# Patient Record
Sex: Male | Born: 1955 | Hispanic: No | Marital: Married | Smoking: Never smoker
Health system: Southern US, Community
[De-identification: ages and names within clinical notes are randomized; demographics above are authoritative.]

## PROBLEM LIST (undated history)

## (undated) DIAGNOSIS — E119 Type 2 diabetes mellitus without complications: Secondary | ICD-10-CM

---

## 2019-07-10 ENCOUNTER — Other Ambulatory Visit: Payer: Self-pay

## 2019-07-10 ENCOUNTER — Ambulatory Visit (INDEPENDENT_AMBULATORY_CARE_PROVIDER_SITE_OTHER): Payer: BLUE CROSS/BLUE SHIELD | Admitting: Podiatry

## 2019-07-10 VITALS — Temp 98.5°F

## 2019-07-10 DIAGNOSIS — L603 Nail dystrophy: Secondary | ICD-10-CM

## 2019-07-14 NOTE — Progress Notes (Signed)
   HPI: 63 y.o. male presenting today as a new patient with a chief complaint of an injury to the left great toenail that occurred two days ago. He states he hit the nail on a table and started experiencing pain immediately. He states the pain has subsided now mostly except for when he walks. He has not done anything for treatment. Patient is here for further evaluation and treatment.   No past medical history on file.   Physical Exam: General: The patient is alert and oriented x3 in no acute distress.  Dermatology: Partially detached left great toenail. Skin is warm, dry and supple bilateral lower extremities. Negative for open lesions or macerations.  Vascular: Palpable pedal pulses bilaterally. No edema or erythema noted. Capillary refill within normal limits.  Neurological: Epicritic and protective threshold grossly intact bilaterally.   Musculoskeletal Exam: Range of motion within normal limits to all pedal and ankle joints bilateral. Muscle strength 5/5 in all groups bilateral.   Assessment: 1. Partially detached left great toenail secondary to stabbing injury    Plan of Care:  1. Patient evaluated.  2. Patient declined avulsion procedure.  3. Silicone toe cap dispensed.  4. Recommended good shoe gear. 5. Return to clinic as needed.       Edrick Kins, DPM [facility]  Dr. Edrick Kins, DPM    [address], [place] [address]                Office [phone]  Fax [phone]

## 2019-07-17 ENCOUNTER — Ambulatory Visit: Payer: BLUE CROSS/BLUE SHIELD | Admitting: Podiatry

## 2019-08-19 ENCOUNTER — Other Ambulatory Visit: Payer: Self-pay

## 2019-08-19 ENCOUNTER — Encounter: Payer: Self-pay | Admitting: Podiatry

## 2019-08-19 ENCOUNTER — Ambulatory Visit (INDEPENDENT_AMBULATORY_CARE_PROVIDER_SITE_OTHER): Payer: BLUE CROSS/BLUE SHIELD | Admitting: Podiatry

## 2019-08-19 DIAGNOSIS — S90212A Contusion of left great toe with damage to nail, initial encounter: Secondary | ICD-10-CM | POA: Diagnosis not present

## 2019-08-19 DIAGNOSIS — M79675 Pain in left toe(s): Secondary | ICD-10-CM | POA: Diagnosis not present

## 2019-08-19 DIAGNOSIS — L603 Nail dystrophy: Secondary | ICD-10-CM

## 2019-08-19 NOTE — Patient Instructions (Signed)

## 2019-08-19 NOTE — Progress Notes (Signed)
  Subjective:  Patient ID: Dennis Lawson, male    DOB: 05/13/1956,  MRN: [id number]  Chief Complaint  Patient presents with  . Nail Problem    follow up left hallux great toenail.  "its doing better, but I want it removed today"    63 y.o. male presents with the above complaint.  He states that he was seeing Dr. Amalia Hailey for this.  He had a contusion of the left great toe a month ago.  Dr. Amalia Hailey I recommended removing the toenails to allow it to drain properly.  However given the amount of pain patient declined to remove the toenail at that time.  He presents today with decreasing pain and would like to have the toenail removed.  He states that the toenail was pretty loose and is getting caught up on things.  He states that it is still aggravating he has not done anything to alleviate the pain.  He denies any other nausea fever chills vomiting shortness of breath.   Review of Systems: Negative except as noted in the HPI. Denies N/V/F/Ch.  No past medical history on file.  Current Outpatient Medications:  .  metFORMIN (GLUMETZA) 1000 MG (MOD) 24 hr tablet, Take 1,000 mg by mouth daily with breakfast., Disp: , Rfl:   Social History   Tobacco Use  Smoking Status Never Smoker  Smokeless Tobacco Never Used    No Known Allergies Objective:  There were no vitals filed for this visit. There is no height or weight on file to calculate BMI. Constitutional Well developed. Well nourished.  Vascular Dorsalis pedis pulses palpable bilaterally. Posterior tibial pulses palpable bilaterally. Capillary refill normal to all digits.  No cyanosis or clubbing noted. Pedal hair growth normal.  Neurologic Normal speech. Oriented to person, place, and time. Epicritic sensation to light touch grossly present bilaterally.  Dermatologic Painful contusion nail at Total nail borders of the hallux nail left. No other open wounds. No skin lesions.  Orthopedic: Normal joint ROM without pain or crepitus  bilaterally. No visible deformities. No bony tenderness.   Radiographs: None Assessment:  No diagnosis found. Plan:  Patient was evaluated and treated and all questions answered.  Total nail avulsion secondary to nail contusion, left -Patient elects to proceed with minor surgery to remove total toenail removal today. Consent reviewed and signed by patient. -Ingrown nail excised. See procedure note. -Educated on post-procedure care including soaking. Written instructions provided and reviewed. -Patient to follow up in 2 weeks for nail check.  Procedure: Excision of Ingrown Toenail Location: Left 1st toe Total nail borders. Anesthesia: Lidocaine 1% plain; 1.5 mL and Marcaine 0.5% plain; 1.5 mL, digital block. Skin Prep: Betadine. Dressing: Silvadene; telfa; dry, sterile, compression dressing. Technique: Following skin prep, the toe was exsanguinated and a tourniquet was secured at the base of the toe. The affected nail border was freed, split with a nail splitter, and excised. The tourniquet was then removed and sterile dressing applied. Disposition: Patient tolerated procedure well. Patient to return in 2 weeks for follow-up.   Return in about 2 weeks (around 09/02/2019).

## 2019-09-02 ENCOUNTER — Ambulatory Visit (INDEPENDENT_AMBULATORY_CARE_PROVIDER_SITE_OTHER): Payer: Self-pay | Admitting: Podiatry

## 2019-09-02 ENCOUNTER — Encounter: Payer: Self-pay | Admitting: Podiatry

## 2019-09-02 ENCOUNTER — Other Ambulatory Visit: Payer: Self-pay

## 2019-09-02 DIAGNOSIS — L603 Nail dystrophy: Secondary | ICD-10-CM

## 2019-09-02 DIAGNOSIS — S90212A Contusion of left great toe with damage to nail, initial encounter: Secondary | ICD-10-CM

## 2019-09-02 DIAGNOSIS — M79675 Pain in left toe(s): Secondary | ICD-10-CM

## 2019-09-02 NOTE — Progress Notes (Signed)
Subjective: Dennis Lawson is a 63 y.o.  male returns to office today for follow up evaluation after having left Hallux  total nail avulsion performed. Patient has been soaking using epsom salt and applying topical antibiotic covered with bandaid daily. Patient denies fevers, chills, nausea, vomiting. Denies any calf pain, chest pain, SOB.  left Objective:  Vitals: Reviewed  General: Well developed, nourished, in no acute distress, alert and oriented x3   Dermatology: Skin is warm, dry and supple bilateral. total hallux nail border appears to be clean, dry, with mild granular tissue and surrounding scab. There is no surrounding erythema, edema, drainage/purulence. The remaining nails appear unremarkable at this time. There are no other lesions or other signs of infection present.  Neurovascular status: Intact. No lower extremity swelling; No pain with calf compression bilateral.  Musculoskeletal: Decreased tenderness to palpation of the total hallux nail bed. Muscular strength within normal limits bilateral.   Assesement and Plan: S/p total nail avulsion, doing well.   -Continue soaking in epsom salts twice a day followed by antibiotic ointment and a band-aid. Can leave uncovered at night. Continue this until completely healed.  -If the area has not healed in 2 weeks, call the office for follow-up appointment, or sooner if any problems arise.  -Monitor for any signs/symptoms of infection. Call the office immediately if any occur or go directly to the emergency room. Call with any questions/concerns.  Boneta Lucks, DPM

## 2019-12-15 ENCOUNTER — Emergency Department: Payer: 59

## 2019-12-15 ENCOUNTER — Other Ambulatory Visit: Payer: Self-pay

## 2019-12-15 ENCOUNTER — Inpatient Hospital Stay
Admission: EM | Admit: 2019-12-15 | Discharge: 2019-12-18 | DRG: 177 | Disposition: A | Discharge status: Home / Self Care | Payer: 59 | Attending: Hospitalist | Admitting: Hospitalist

## 2019-12-15 DIAGNOSIS — Z681 Body mass index (BMI) 19 or less, adult: Secondary | ICD-10-CM | POA: Diagnosis not present

## 2019-12-15 DIAGNOSIS — J1282 Pneumonia due to coronavirus disease 2019: Secondary | ICD-10-CM | POA: Diagnosis present

## 2019-12-15 DIAGNOSIS — E119 Type 2 diabetes mellitus without complications: Secondary | ICD-10-CM

## 2019-12-15 DIAGNOSIS — R636 Underweight: Secondary | ICD-10-CM | POA: Diagnosis present

## 2019-12-15 DIAGNOSIS — J069 Acute upper respiratory infection, unspecified: Secondary | ICD-10-CM | POA: Diagnosis present

## 2019-12-15 DIAGNOSIS — R197 Diarrhea, unspecified: Secondary | ICD-10-CM | POA: Diagnosis present

## 2019-12-15 DIAGNOSIS — T380X5A Adverse effect of glucocorticoids and synthetic analogues, initial encounter: Secondary | ICD-10-CM | POA: Diagnosis present

## 2019-12-15 DIAGNOSIS — J9601 Acute respiratory failure with hypoxia: Secondary | ICD-10-CM | POA: Diagnosis present

## 2019-12-15 DIAGNOSIS — U071 COVID-19: Secondary | ICD-10-CM | POA: Diagnosis present

## 2019-12-15 DIAGNOSIS — A0839 Other viral enteritis: Secondary | ICD-10-CM | POA: Diagnosis present

## 2019-12-15 DIAGNOSIS — R0902 Hypoxemia: Secondary | ICD-10-CM

## 2019-12-15 DIAGNOSIS — Z833 Family history of diabetes mellitus: Secondary | ICD-10-CM

## 2019-12-15 DIAGNOSIS — Z7984 Long term (current) use of oral hypoglycemic drugs: Secondary | ICD-10-CM | POA: Diagnosis not present

## 2019-12-15 DIAGNOSIS — Y92239 Unspecified place in hospital as the place of occurrence of the external cause: Secondary | ICD-10-CM | POA: Diagnosis present

## 2019-12-15 DIAGNOSIS — E1165 Type 2 diabetes mellitus with hyperglycemia: Secondary | ICD-10-CM | POA: Diagnosis present

## 2019-12-15 HISTORY — DX: Type 2 diabetes mellitus without complications: E11.9

## 2019-12-15 LAB — COMPREHENSIVE METABOLIC PANEL
ALT: 56 U/L — ABNORMAL HIGH (ref 0–44)
AST: 38 U/L (ref 15–41)
Albumin: 3.2 g/dL — ABNORMAL LOW (ref 3.5–5.0)
Alkaline Phosphatase: 55 U/L (ref 38–126)
Anion gap: 16 — ABNORMAL HIGH (ref 5–15)
BUN: 10 mg/dL (ref 8–23)
CO2: 23 mmol/L (ref 22–32)
Calcium: 8.8 mg/dL — ABNORMAL LOW (ref 8.9–10.3)
Chloride: 93 mmol/L — ABNORMAL LOW (ref 98–111)
Creatinine, Ser: 0.72 mg/dL (ref 0.61–1.24)
GFR calc Af Amer: >60 mL/min (ref >60)
GFR calc non Af Amer: >60 mL/min (ref >60)
Glucose, Bld: 260 mg/dL — ABNORMAL HIGH (ref 70–99)
Potassium: 3.8 mmol/L (ref 3.5–5.1)
Sodium: 132 mmol/L — ABNORMAL LOW (ref 135–145)
Total Bilirubin: 1.1 mg/dL (ref 0.3–1.2)
Total Protein: 8 g/dL (ref 6.5–8.1)

## 2019-12-15 LAB — TRIGLYCERIDES: Triglycerides: 137 mg/dL (ref <150)

## 2019-12-15 LAB — CBC WITH DIFFERENTIAL/PLATELET
Abs Immature Granulocytes: 0.03 10*3/uL (ref 0.00–0.07)
Basophils Absolute: 0 10*3/uL (ref 0.0–0.1)
Basophils Relative: 0 %
Eosinophils Absolute: 0 10*3/uL (ref 0.0–0.5)
Eosinophils Relative: 1 %
HCT: 43 % (ref 39.0–52.0)
Hemoglobin: 15.3 g/dL (ref 13.0–17.0)
Immature Granulocytes: 1 %
Lymphocytes Relative: 22 %
Lymphs Abs: 1 10*3/uL (ref 0.7–4.0)
MCH: 29 pg (ref 26.0–34.0)
MCHC: 35.6 g/dL (ref 30.0–36.0)
MCV: 81.6 fL (ref 80.0–100.0)
Monocytes Absolute: 0.7 10*3/uL (ref 0.1–1.0)
Monocytes Relative: 15 %
Neutro Abs: 2.9 10*3/uL (ref 1.7–7.7)
Neutrophils Relative %: 61 %
Platelets: 278 10*3/uL (ref 150–400)
RBC: 5.27 MIL/uL (ref 4.22–5.81)
RDW: 11.9 % (ref 11.5–15.5)
WBC: 4.7 10*3/uL (ref 4.0–10.5)
nRBC: 0 % (ref 0.0–0.2)

## 2019-12-15 LAB — HIV ANTIBODY (ROUTINE TESTING W REFLEX): HIV Screen 4th Generation wRfx: NONREACTIVE

## 2019-12-15 LAB — FIBRIN DERIVATIVES D-DIMER (ARMC ONLY): Fibrin derivatives D-dimer (ARMC): 567.2 ng/mL (FEU) — ABNORMAL HIGH (ref 0.00–499.00)

## 2019-12-15 LAB — LACTIC ACID, PLASMA
Lactic Acid, Venous: 1.7 mmol/L (ref 0.5–1.9)
Lactic Acid, Venous: 1.1 mmol/L (ref 0.5–1.9)

## 2019-12-15 LAB — GLUCOSE, CAPILLARY
Glucose-Capillary: 349 mg/dL — ABNORMAL HIGH (ref 70–99)
Glucose-Capillary: 231 mg/dL — ABNORMAL HIGH (ref 70–99)

## 2019-12-15 LAB — FERRITIN
Ferritin: 172 ng/mL (ref 24–336)
Ferritin: 132 ng/mL (ref 24–336)

## 2019-12-15 LAB — HEPATITIS B SURFACE ANTIGEN: Hepatitis B Surface Ag: NONREACTIVE

## 2019-12-15 LAB — C-REACTIVE PROTEIN: CRP: 5 mg/dL — ABNORMAL HIGH (ref <1.0)

## 2019-12-15 LAB — LACTATE DEHYDROGENASE: LDH: 202 U/L — ABNORMAL HIGH (ref 98–192)

## 2019-12-15 LAB — BRAIN NATRIURETIC PEPTIDE: B Natriuretic Peptide: 44 pg/mL (ref 0.0–100.0)

## 2019-12-15 LAB — FIBRINOGEN: Fibrinogen: 614 mg/dL — ABNORMAL HIGH (ref 210–475)

## 2019-12-15 LAB — PROCALCITONIN: Procalcitonin: <0.1 ng/mL

## 2019-12-15 LAB — TROPONIN I (HIGH SENSITIVITY)
Troponin I (High Sensitivity): 4 ng/L (ref <18)
Troponin I (High Sensitivity): 3 ng/L (ref <18)

## 2019-12-15 MED ORDER — ENOXAPARIN SODIUM 40 MG/0.4ML ~~LOC~~ SOLN
40.0000 mg | SUBCUTANEOUS | Status: DC
  Administered 2019-12-15 – 2019-12-17 (×3): 40 mg via SUBCUTANEOUS
  Filled 2019-12-15 (×3): qty 0.4

## 2019-12-15 MED ORDER — ONDANSETRON HCL 4 MG/2ML IJ SOLN: 4.0000 mg | Freq: Three times a day (TID) | INTRAMUSCULAR | Status: DC | PRN

## 2019-12-15 MED ORDER — SODIUM CHLORIDE 0.9 % IV SOLN
100.0000 mg | Freq: Every day | INTRAVENOUS | Status: DC
  Administered 2019-12-16 – 2019-12-18 (×3): 100 mg via INTRAVENOUS
  Filled 2019-12-15 (×3): qty 20

## 2019-12-15 MED ORDER — SODIUM CHLORIDE 0.9 % IV SOLN
200.0000 mg | Freq: Once | INTRAVENOUS | Status: AC
  Administered 2019-12-15: 11:00:00 200 mg via INTRAVENOUS
  Filled 2019-12-15: qty 200

## 2019-12-15 MED ORDER — ACETAMINOPHEN 325 MG PO TABS: 650.0000 mg | ORAL_TABLET | Freq: Four times a day (QID) | ORAL | Status: DC | PRN

## 2019-12-15 MED ORDER — DM-GUAIFENESIN ER 30-600 MG PO TB12
1.0000 | ORAL_TABLET | Freq: Two times a day (BID) | ORAL | Status: DC
  Administered 2019-12-15 – 2019-12-18 (×7): 1 via ORAL
  Filled 2019-12-15 (×7): qty 1

## 2019-12-15 MED ORDER — ALBUTEROL SULFATE HFA 108 (90 BASE) MCG/ACT IN AERS
2.0000 | INHALATION_SPRAY | RESPIRATORY_TRACT | Status: DC | PRN
  Filled 2019-12-15: qty 6.7

## 2019-12-15 MED ORDER — INSULIN ASPART 100 UNIT/ML ~~LOC~~ SOLN
0.0000 [IU] | Freq: Every day | SUBCUTANEOUS | Status: DC
  Administered 2019-12-15: 2 [IU] via SUBCUTANEOUS
  Filled 2019-12-15: qty 1

## 2019-12-15 MED ORDER — ZINC SULFATE 220 (50 ZN) MG PO CAPS
220.0000 mg | ORAL_CAPSULE | Freq: Every day | ORAL | Status: DC
  Administered 2019-12-15 – 2019-12-18 (×4): 220 mg via ORAL
  Filled 2019-12-15 (×4): qty 1

## 2019-12-15 MED ORDER — VITAMIN B-12 1000 MCG PO TABS
1000.0000 ug | ORAL_TABLET | Freq: Every day | ORAL | Status: DC
  Administered 2019-12-16 – 2019-12-18 (×3): 1000 ug via ORAL
  Filled 2019-12-15 (×3): qty 1

## 2019-12-15 MED ORDER — PNEUMOCOCCAL VAC POLYVALENT 25 MCG/0.5ML IJ INJ: 0.5000 mL | INJECTION | INTRAMUSCULAR | Status: DC

## 2019-12-15 MED ORDER — ASCORBIC ACID 500 MG PO TABS
500.0000 mg | ORAL_TABLET | Freq: Every day | ORAL | Status: DC
  Administered 2019-12-15 – 2019-12-18 (×4): 500 mg via ORAL
  Filled 2019-12-15 (×4): qty 1

## 2019-12-15 MED ORDER — IPRATROPIUM BROMIDE HFA 17 MCG/ACT IN AERS
2.0000 | INHALATION_SPRAY | RESPIRATORY_TRACT | Status: DC
  Administered 2019-12-15 – 2019-12-18 (×17): 2 via RESPIRATORY_TRACT
  Filled 2019-12-15: qty 12.9

## 2019-12-15 MED ORDER — METHYLPREDNISOLONE SODIUM SUCC 40 MG IJ SOLR
40.0000 mg | Freq: Two times a day (BID) | INTRAMUSCULAR | Status: DC
  Administered 2019-12-15 – 2019-12-17 (×4): 40 mg via INTRAVENOUS
  Filled 2019-12-15 (×4): qty 1

## 2019-12-15 MED ORDER — DEXAMETHASONE SODIUM PHOSPHATE 10 MG/ML IJ SOLN
10.0000 mg | Freq: Once | INTRAMUSCULAR | Status: AC
  Administered 2019-12-15: 10 mg via INTRAVENOUS
  Filled 2019-12-15: qty 1

## 2019-12-15 MED ORDER — INSULIN ASPART 100 UNIT/ML ~~LOC~~ SOLN
0.0000 [IU] | Freq: Three times a day (TID) | SUBCUTANEOUS | Status: DC
  Administered 2019-12-15 – 2019-12-16 (×2): 7 [IU] via SUBCUTANEOUS
  Filled 2019-12-15 (×2): qty 1

## 2019-12-15 NOTE — ED Notes (Signed)
Attempted to call receiving floor. Gave name and number. Awaiting return call.

## 2019-12-15 NOTE — ED Notes (Signed)
Decreased [place] from 4 L to 2 L. Will continue to monitor. Pt resting at this time.

## 2019-12-15 NOTE — H&P (Signed)
History and Physical    Dennis Lawson VQQ:[id number] DOB: [birth year] DOA: 12/15/2019  Referring MD/NP/PA:   PCP: Barbette Reichmann, MD   Patient coming from:  The patient is coming from home.  At baseline, pt is independent for most of ADL.        Chief Complaint: Shortness of breath and cough  HPI: Dennis Lawson is a 64 y.o. male with medical history significant of diabetes mellitus, who presents with a short of breath and a cough.  Patient was diagnosed with COVID-19 infection 10 days ago, he received monoclonal antibody treatment at the clinic on 2/25.  Patient continues to have shortness of breath and productive cough.  Patient does not have chest pain.  Patient has some chest congestion.  He had fever of 101 on Monday and chills.  Patient has some diarrhea, no vomiting, abdominal pain.  No symptoms of UTI or unilateral weakness. Pt had oxygen desaturation to 81% on room air.  ED Course: pt was found to have WBC 4.3, troponin IV, lactic acid 1.7, electrolytes renal function okay, liver function (ALP 55, AST 38, ALT 56, total bilirubin 1.1), temperature normal, blood pressure 13/83, heart rate 67, tachypnea, oxygen saturation 95%-99% on 4 L nasal cannula oxygen.  Chest x-ray showed bilateral patchy infiltration.  Patient is admitted to MedSurg bed as inpatient.  Review of Systems:   General: has fevers, chills, no body weight gain, has fatigue HEENT: no blurry vision, hearing changes or sore throat Respiratory: has dyspnea, coughing, no wheezing CV: no chest pain, no palpitations GI: no nausea, vomiting, abdominal pain, diarrhea, constipation GU: no dysuria, burning on urination, increased urinary frequency, hematuria  Ext: no leg edema Neuro: no unilateral weakness, numbness, or tingling, no vision change or hearing loss Skin: no rash, no skin tear. MSK: No muscle spasm, no deformity, no limitation of range of movement in spin Heme: No easy bruising.  Travel history: No recent long  distant travel.  Allergy: No Known Allergies  Past Medical History:  Diagnosis Date  . Diabetes mellitus without complication (HCC)     History reviewed. No pertinent surgical history.  Social History:  reports that he has never smoked. He has never used smokeless tobacco. He reports previous alcohol use. He reports that he does not use drugs.  Family History:  Family History  Problem Relation Age of Onset  . Diabetes Mellitus II Mother   . Dementia Father      Prior to Admission medications   Medication Sig Start Date End Date Taking? Authorizing Provider  metFORMIN (GLUMETZA) 1000 MG (MOD) 24 hr tablet Take 1,000 mg by mouth daily with breakfast.    [provider]    Physical Exam: Vitals:   12/15/19 1115 12/15/19 1130 12/15/19 1200 12/15/19 1201  BP:  133/76 (!) 145/83   Pulse: 76 (!) 53 73 72  Resp: (!) 22 (!) 35 (!) 38 19  Temp:      SpO2: 97% 100% 97% 97%  Weight:      Height:       General: Not in acute distress HEENT:       Eyes: PERRL, EOMI, no scleral icterus.       ENT: No discharge from the ears and nose, no pharynx injection, no tonsillar enlargement.        Neck: No JVD, no bruit, no mass felt. Heme: No neck lymph node enlargement. Cardiac: S1/S2, RRR, No murmurs, No gallops or rubs. Respiratory:  No rales, wheezing, rhonchi or rubs. GI:  Soft, nondistended, nontender, no rebound pain, no organomegaly, BS present. GU: No hematuria Ext: No pitting leg edema bilaterally. 2+DP/PT pulse bilaterally. Musculoskeletal: No joint deformities, No joint redness or warmth, no limitation of ROM in spin. Skin: No rashes.  Neuro: Alert, oriented X3, cranial nerves II-XII grossly intact, moves all extremities normally.  Psych: Patient is not psychotic, no suicidal or hemocidal ideation.  Labs on Admission: I have personally reviewed following labs and imaging studies  CBC: Recent Labs  Lab 12/15/19 0825  WBC 4.7  NEUTROABS 2.9  HGB 15.3  HCT 43.0    MCV 81.6  PLT 161   Basic Metabolic Panel: Recent Labs  Lab 12/15/19 0825  NA 132*  K 3.8  CL 93*  CO2 23  GLUCOSE 260*  BUN 10  CREATININE 0.72  CALCIUM 8.8*   GFR: Estimated Creatinine Clearance: 80.2 mL/min (by C-G formula based on SCr of 0.72 mg/dL). Liver Function Tests: Recent Labs  Lab 12/15/19 0825  AST 38  ALT 56*  ALKPHOS 55  BILITOT 1.1  PROT 8.0  ALBUMIN 3.2*   No results for input(s): LIPASE, AMYLASE in the last 168 hours. No results for input(s): AMMONIA in the last 168 hours. Coagulation Profile: No results for input(s): INR, PROTIME in the last 168 hours. Cardiac Enzymes: No results for input(s): CKTOTAL, CKMB, CKMBINDEX, TROPONINI in the last 168 hours. BNP (last 3 results) No results for input(s): PROBNP in the last 8760 hours. HbA1C: No results for input(s): HGBA1C in the last 72 hours. CBG: No results for input(s): GLUCAP in the last 168 hours. Lipid Profile: Recent Labs    12/15/19 0825  TRIG 137   Thyroid Function Tests: No results for input(s): TSH, T4TOTAL, FREET4, T3FREE, THYROIDAB in the last 72 hours. Anemia Panel: Recent Labs    12/15/19 0825  FERRITIN 172   Urine analysis: No results found for: COLORURINE, APPEARANCEUR, LABSPEC, PHURINE, GLUCOSEU, HGBUR, BILIRUBINUR, KETONESUR, PROTEINUR, UROBILINOGEN, NITRITE, LEUKOCYTESUR Sepsis Labs: @LABRCNTIP (procalcitonin:4,lacticidven:4) )No results found for this or any previous visit (from the past 240 hour(s)).   Radiological Exams on Admission: DG Chest Port 1 View  Result Date: 12/15/2019 CLINICAL DATA:  COVID positive. Short notice of breath. EXAM: PORTABLE CHEST 1 VIEW COMPARISON:  None. FINDINGS: Patchy airspace disease noted bilaterally with a basilar predominance, left greater than right. No pleural effusion. The cardiopericardial silhouette is within normal limits for size. The visualized bony structures of the thorax are intact. Telemetry leads overlie the chest.  IMPRESSION: Patchy bibasilar airspace disease, left greater than right. Electronically Signed   By: Misty Stanley M.D.   On: 12/15/2019 09:00     EKG: Independently reviewed.  Sinus rhythm, QTC 433, anteroseptal infarction pattern  Assessment/Plan Principal Problem:   Acute respiratory disease due to COVID-19 virus Active Problems:   Diabetes mellitus without complication (HCC)   Acute respiratory failure with hypoxia (HCC)   Diarrhea   Acute respiratory failure with hypoxia due to acute respiratory disease due to COVID-19 virus: Patient had oxygen desaturated to 81% on room air, initially needed 4 L nasal cannula oxygen, currently on 2 L nasal cannula oxygen with oxygen saturation 97%.  Chest x-ray showed bilateral patchy infiltration.  -will admit to med-surg bed as inpt -Remdesivir per pharm -Solumedrol 40 mg bid -vitamin C, zinc.  -Bronchodilators -PRN Mucinex for cough -f/u Blood culture -D-dimer, BNP,Trop, LFT, CRP, LDH, Procalcitonin, Ferritin, fibinogen, TG, Hep B SAg, HIV ab -Daily CRP, Ferritin, D-dimer, -Will ask the patient to maintain an awake prone  position for 16+ hours a day, if possible, with a minimum of 2-3 hours at a time -Will attempt to maintain euvolemia to a net negative fluid status -IF patient deteriorates, will consult PCCM and ID  Diarrhea: Most likely due to Covid infection. -Observe closely.  If patient has persistent worsening diarrhea, may need to check C. difficile.  Diabetes mellitus without complication (HCC):  Most recent A1c 11.0, poorly controled. Patient is taking Metformin and Amaryl at home -SSI   Inpatient status:  # Patient requires inpatient status due to high intensity of service, high risk for further deterioration and high frequency of surveillance required.  I certify that at the point of admission it is my clinical judgment that the patient will require inpatient hospital care spanning beyond 2 midnights from the point of  admission.  . This patient has hx of DM . Now patient has presenting with Acute respiratory disease due to COVID-19 virus and acute respiratory failure with hypoxia . The initial radiographic and laboratory data are worrisome because of bilateral patchy infiltrates on chest x-ray . Current medical needs: please see my assessment and plan . Predictability of an adverse outcome (risk): Patient has multiple comorbidities as listed above. Now presents with acute respiratory disease due to COVID-19 virus and acute respiratory failure with hypoxia. Patient's presentation is highly complicated.  Patient is at high risk of deteriorating.  Will need to be treated in hospital for at least 2 days.      DVT ppx:  SQ Lovenox Code Status: Full code Family Communication:   Yes, patient's  Daughter by phone   Disposition Plan:  Anticipate discharge back to previous home environment Consults called:  none Admission status: Med-surg as inpt       Date of Service 12/15/2019    Lorretta Harp [facility]   If 7PM-7AM, please contact night-coverage [email or web address] 12/15/2019, 12:54 PM

## 2019-12-15 NOTE — Consult Note (Addendum)
Remdesivir - Pharmacy Brief Note   A/P:  Remdesivir 200 mg IVPB once followed by 100 mg IVPB daily x 4 days.   No apparent history of remdesivir treatment  Tested positive at an outpatient clinic on 12/08/19.  Albina Billet, PharmD, BCPS Clinical Pharmacist 12/15/2019 10:20 AM

## 2019-12-15 NOTE — ED Triage Notes (Signed)
Pt arrives via [facility] from home for COVID + 10 days ago. C/o of SOB, diarrhea, cough. States had IV antibotics and steroids. Denies fever. 81% RA, placed on 4 L [place] and came up to 95%. Coughing upon arrival. Able to talk in complete sentences. A&O.

## 2019-12-15 NOTE — ED Provider Notes (Signed)
[facility] Emergency Department Provider Note  Time seen: 8:17 AM  I have reviewed the triage vital signs and the nursing notes.   HISTORY  Chief Complaint Shortness of Breath and COVID   HPI Dennis Lawson is a 64 y.o. male with a past medical history of diabetes presents to the emergency department for shortness of breath.  According to the patient he was diagnosed with Covid approximately 10 days ago.   Patient symptoms started 12/06/2019.  Patient states in record review shows patient received monoclonal antibody therapy 12/12/2019.  Patient states mild chest discomfort when coughing only.  Patient states shortness of breath currently 81% room air saturation upon arrival to the emergency department with no home O2 requirement.  No abdominal pain no vomiting, but occasional diarrhea.  Currently satting 95% on 4 L of oxygen.  Past Medical History:  Diagnosis Date  . Diabetes mellitus without complication (HCC)     There are no problems to display for this patient.   History reviewed. No pertinent surgical history.  Prior to Admission medications   Medication Sig Start Date End Date Taking? Authorizing Provider  metFORMIN (GLUMETZA) 1000 MG (MOD) 24 hr tablet Take 1,000 mg by mouth daily with breakfast.    [provider]    No Known Allergies  History reviewed. No pertinent family history.  Social History Social History   Tobacco Use  . Smoking status: Never Smoker  . Smokeless tobacco: Never Used  Substance Use Topics  . Alcohol use: Not Currently  . Drug use: Not on file    Review of Systems Constitutional: Positive for intermittent fever Cardiovascular: Mild chest pain worse with cough Respiratory: Positive for shortness of breath.  Positive for cough. Gastrointestinal: Negative for abdominal pain, vomiting.  Occasional diarrhea. Musculoskeletal: Negative for musculoskeletal complaints Neurological: Negative for headache All other  ROS negative  ____________________________________________   PHYSICAL EXAM:  VITAL SIGNS: ED Triage Vitals  Enc Vitals Group     BP --      Pulse --      Resp --      Temp --      Temp src --      SpO2 --      Weight 12/15/19 0815 134 lb (60.8 kg)     Height 12/15/19 0815 5\' 9"  (1.753 m)     Head Circumference --      Peak Flow --      Pain Score 12/15/19 0814 9     Pain Loc --      Pain Edu? --      Excl. in GC? --    Constitutional: Alert and oriented.  Patient is tachypneic but no significant respiratory distress. Eyes: Normal exam ENT      Head: Normocephalic and atraumatic.      Mouth/Throat: Mucous membranes are moist. Cardiovascular: Normal rate, regular rhythm.  Respiratory: Mild tachypnea with frequent coughing.  Overall clear lung sounds by auscultation. Gastrointestinal: Soft and nontender. No distention. Musculoskeletal: Nontender with normal range of motion in all extremities Neurologic:  Normal speech and language. No gross focal neurologic deficits  Skin:  Skin is warm, dry and intact.  Psychiatric: Mood and affect are normal.  ____________________________________________    EKG  EKG viewed and interpreted by myself shows a normal sinus rhythm at 59 bpm with a narrow QRS, normal axis, normal intervals, nonspecific ST changes.  ____________________________________________    RADIOLOGY  Chest x-ray shows patchy bibasilar airspace opacities.  ____________________________________________  INITIAL IMPRESSION / ASSESSMENT AND PLAN / ED COURSE  Pertinent labs & imaging results that were available during my care of the patient were reviewed by me and considered in my medical decision making (see chart for details).   Patient presents to the emergency department, Covid positive for worsening shortness of breath no hypoxia.  Symptoms started approximately 10 days ago at this time.  Patient received monoclonal antibody therapy 12/12/2019.  Patient states  continues to have intermittent fever continues to have intermittent diarrhea feels the shortness of breath has worsened over the past few days along with worsening cough.  We will check labs, chest x-ray.  We will dose IV Decadron and continue to closely monitor while awaiting results.  Patient agreeable to plan of care.  Given the patient's new O2 requirement anticipate likely admission to the hospital once his emergency department work-up is been completed.  X-ray shows bilateral opacities consistent with COVID-19.  Patient's inflammatory markers remain elevated.  Given the patient's hypoxia with oxygen requirement we will admit to the hospitalist service.  Dennis Lawson was evaluated in Emergency Department on 12/15/2019 for the symptoms described in the history of present illness. He was evaluated in the context of the global COVID-19 pandemic, which necessitated consideration that the patient might be at risk for infection with the SARS-CoV-2 virus that causes COVID-19. Institutional protocols and algorithms that pertain to the evaluation of patients at risk for COVID-19 are in a state of rapid change based on information released by regulatory bodies including the CDC and federal and state organizations. These policies and algorithms were followed during the patient's care in the ED.  ____________________________________________   FINAL CLINICAL IMPRESSION(S) / ED DIAGNOSES  COVID-19 Hypoxia Dyspnea   Harvest Dark, MD 12/15/19 1028

## 2019-12-16 DIAGNOSIS — R197 Diarrhea, unspecified: Secondary | ICD-10-CM

## 2019-12-16 LAB — COMPREHENSIVE METABOLIC PANEL
ALT: 53 U/L — ABNORMAL HIGH (ref 0–44)
AST: 32 U/L (ref 15–41)
Albumin: 2.9 g/dL — ABNORMAL LOW (ref 3.5–5.0)
Alkaline Phosphatase: 55 U/L (ref 38–126)
Anion gap: 11 (ref 5–15)
BUN: 21 mg/dL (ref 8–23)
CO2: 23 mmol/L (ref 22–32)
Calcium: 8.7 mg/dL — ABNORMAL LOW (ref 8.9–10.3)
Chloride: 100 mmol/L (ref 98–111)
Creatinine, Ser: 0.62 mg/dL (ref 0.61–1.24)
GFR calc Af Amer: >60 mL/min (ref >60)
GFR calc non Af Amer: >60 mL/min (ref >60)
Glucose, Bld: 267 mg/dL — ABNORMAL HIGH (ref 70–99)
Potassium: 4.4 mmol/L (ref 3.5–5.1)
Sodium: 134 mmol/L — ABNORMAL LOW (ref 135–145)
Total Bilirubin: 0.8 mg/dL (ref 0.3–1.2)
Total Protein: 7.1 g/dL (ref 6.5–8.1)

## 2019-12-16 LAB — CBC WITH DIFFERENTIAL/PLATELET
Abs Immature Granulocytes: 0.02 10*3/uL (ref 0.00–0.07)
Basophils Absolute: 0 10*3/uL (ref 0.0–0.1)
Basophils Relative: 0 %
Eosinophils Absolute: 0 10*3/uL (ref 0.0–0.5)
Eosinophils Relative: 0 %
HCT: 39.6 % (ref 39.0–52.0)
Hemoglobin: 14 g/dL (ref 13.0–17.0)
Immature Granulocytes: 1 %
Lymphocytes Relative: 15 %
Lymphs Abs: 0.6 10*3/uL — ABNORMAL LOW (ref 0.7–4.0)
MCH: 29.2 pg (ref 26.0–34.0)
MCHC: 35.4 g/dL (ref 30.0–36.0)
MCV: 82.5 fL (ref 80.0–100.0)
Monocytes Absolute: 0.3 10*3/uL (ref 0.1–1.0)
Monocytes Relative: 9 %
Neutro Abs: 2.9 10*3/uL (ref 1.7–7.7)
Neutrophils Relative %: 75 %
Platelets: 337 10*3/uL (ref 150–400)
RBC: 4.8 MIL/uL (ref 4.22–5.81)
RDW: 11.9 % (ref 11.5–15.5)
WBC: 3.9 10*3/uL — ABNORMAL LOW (ref 4.0–10.5)
nRBC: 0 % (ref 0.0–0.2)

## 2019-12-16 LAB — GLUCOSE, CAPILLARY
Glucose-Capillary: 333 mg/dL — ABNORMAL HIGH (ref 70–99)
Glucose-Capillary: 291 mg/dL — ABNORMAL HIGH (ref 70–99)
Glucose-Capillary: 259 mg/dL — ABNORMAL HIGH (ref 70–99)
Glucose-Capillary: 231 mg/dL — ABNORMAL HIGH (ref 70–99)

## 2019-12-16 LAB — C-REACTIVE PROTEIN: CRP: 2.7 mg/dL — ABNORMAL HIGH (ref <1.0)

## 2019-12-16 LAB — MAGNESIUM: Magnesium: 2.2 mg/dL (ref 1.7–2.4)

## 2019-12-16 LAB — FIBRIN DERIVATIVES D-DIMER (ARMC ONLY): Fibrin derivatives D-dimer (ARMC): 415.36 ng/mL (FEU) (ref 0.00–499.00)

## 2019-12-16 LAB — FERRITIN: Ferritin: 142 ng/mL (ref 24–336)

## 2019-12-16 MED ORDER — INSULIN DETEMIR 100 UNIT/ML ~~LOC~~ SOLN
0.0750 [IU]/kg | Freq: Two times a day (BID) | SUBCUTANEOUS | Status: DC
  Administered 2019-12-16 – 2019-12-17 (×3): 5 [IU] via SUBCUTANEOUS
  Filled 2019-12-16 (×3): qty 1

## 2019-12-16 MED ORDER — INSULIN ASPART 100 UNIT/ML ~~LOC~~ SOLN
3.0000 [IU] | Freq: Three times a day (TID) | SUBCUTANEOUS | Status: DC
  Administered 2019-12-16 – 2019-12-17 (×3): 3 [IU] via SUBCUTANEOUS
  Filled 2019-12-16 (×3): qty 1

## 2019-12-16 MED ORDER — LINAGLIPTIN 5 MG PO TABS
5.0000 mg | ORAL_TABLET | Freq: Every day | ORAL | Status: DC
  Administered 2019-12-16 – 2019-12-18 (×3): 5 mg via ORAL
  Filled 2019-12-16 (×4): qty 1

## 2019-12-16 MED ORDER — INSULIN ASPART 100 UNIT/ML ~~LOC~~ SOLN
0.0000 [IU] | Freq: Every day | SUBCUTANEOUS | Status: DC
  Administered 2019-12-16 – 2019-12-17 (×2): 2 [IU] via SUBCUTANEOUS
  Filled 2019-12-16 (×2): qty 1

## 2019-12-16 MED ORDER — INSULIN ASPART 100 UNIT/ML ~~LOC~~ SOLN
0.0000 [IU] | Freq: Three times a day (TID) | SUBCUTANEOUS | Status: DC
  Administered 2019-12-16 (×2): 8 [IU] via SUBCUTANEOUS
  Administered 2019-12-17: 3 [IU] via SUBCUTANEOUS
  Administered 2019-12-17: 12:00:00 5 [IU] via SUBCUTANEOUS
  Administered 2019-12-18: 08:00:00 8 [IU] via SUBCUTANEOUS
  Administered 2019-12-18: 3 [IU] via SUBCUTANEOUS
  Filled 2019-12-16 (×6): qty 1

## 2019-12-16 NOTE — Progress Notes (Signed)
Inpatient Diabetes Program Recommendations  AACE/ADA: New Consensus Statement on Inpatient Glycemic Control   Target Ranges:  Prepandial:   less than 140 mg/dL      Peak postprandial:   less than 180 mg/dL (1-2 hours)      Critically ill patients:  140 - 180 mg/dL   Results for PAU, BANH (MRN [id number]) as of 12/16/2019 10:26  Ref. Range 12/15/2019 16:38 12/15/2019 21:04 12/16/2019 07:47  Glucose-Capillary Latest Ref Range: 70 - 99 mg/dL 136 (H) 859 (H) 923 (H)   Review of Glycemic Control  Diabetes history: DM2 Outpatient Diabetes medications: Amaryl 2 am QAM, Metformin 1000 mg BID Current orders for Inpatient glycemic control: Novolog 0-9 units TID with meals, Novolog 0-5 units QHS; Solumedrol 40 mg Q12H  Inpatient Diabetes Program Recommendations:  Please consider using COVID-10 Glycemic Control order set to order Tradjenta 5 mg daily and if steroids are continued Levemir 6 units BID.  Thanks, Orlando Penner, RN, MSN, CDE Diabetes Coordinator Inpatient Diabetes Program [phone] (Team Pager from 8am to 5pm)

## 2019-12-16 NOTE — TOC Initial Note (Signed)
Transition of Care [facility]) - Initial/Assessment Note    Patient Details  Name: Dennis Lawson MRN: [id number] Date of Birth: 05/31/1956  Transition of Care [facility] CM/SW Contact:    Allayne Butcher, RN Phone Number: 12/16/2019, 11:58 AM  Clinical Narrative:                 Patient admitted with COVID requiring supplemental oxygen at 4L via [place].  Patient reports that he is from home with his wife.  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  Patient is current with Dr. Marcello Fennel and [facility].  No discharge needs identified at this time.   Expected Discharge Plan: Home/Self Care Barriers to Discharge: Continued Medical Work up   Patient Goals and CMS Choice        Expected Discharge Plan and Services Expected Discharge Plan: Home/Self Care                                              Prior Living Arrangements/Services   Lives with:: Spouse Patient language and need for interpreter reviewed:: Yes Do you feel safe going back to the place where you live?: Yes      Need for Family Participation in Patient Care: Yes (Comment)(COVID) Care giver support system in place?: Yes (comment)(wife)   Criminal Activity/Legal Involvement Pertinent to Current Situation/Hospitalization: No - Comment as needed  Activities of Daily Living Home Assistive Devices/Equipment: None ADL Screening (condition at time of admission) Patient's cognitive ability adequate to safely complete daily activities?: Yes Is the patient deaf or have difficulty hearing?: No Does the patient have difficulty seeing, even when wearing glasses/contacts?: No Does the patient have difficulty concentrating, remembering, or making decisions?: No Patient able to express need for assistance with ADLs?: Yes Does the patient have difficulty dressing or bathing?: No Independently performs ADLs?: Yes (appropriate for developmental age) Does the patient have difficulty walking or climbing stairs?:  No Weakness of Legs: None Weakness of Arms/Hands: None  Permission Sought/Granted                  Emotional Assessment Appearance:: Appears stated age Attitude/Demeanor/Rapport: Engaged Affect (typically observed): Accepting Orientation: : Oriented to Self, Oriented to Place, Oriented to  Time, Oriented to Situation Alcohol / Substance Use: Not Applicable Psych Involvement: No (comment)  Admission diagnosis:  Hypoxia [R09.02] Acute respiratory disease due to COVID-19 virus [U07.1, J06.9] COVID-19 [U07.1] Patient Active Problem List   Diagnosis Date Noted  . Acute respiratory disease due to COVID-19 virus 12/15/2019  . Acute respiratory failure with hypoxia (HCC) 12/15/2019  . Diarrhea 12/15/2019  . Diabetes mellitus without complication [facility])    PCP:  Barbette Reichmann, MD Pharmacy:   [id number] [place] [place] - [address] [place] [address] Phone: [phone] Fax: [phone]     Social Determinants of Health (SDOH) Interventions    Readmission Risk Interventions No flowsheet data found.

## 2019-12-16 NOTE — Progress Notes (Signed)
1        [place] at [facility]   PATIENT NAME: Dennis Lawson    MR#:  [id number]  DATE OF BIRTH:  Mar 14, 1956  SUBJECTIVE:  CHIEF COMPLAINT:   Chief Complaint  Patient presents with  . Shortness of Breath  . COVID  Gets dyspneic and hypoxic on minimal exertion, was on 1 L oxygen till yesterday and then became tachypneic/hypoxic on minimal exertion requiring 4 L oxygen since last night.  Patient reports weight loss over last few weeks as he does not have much appetite.  He does feel short of breath. REVIEW OF SYSTEMS:  Review of Systems  Constitutional: Positive for weight loss. Negative for diaphoresis, fever and malaise/fatigue.  HENT: Negative for ear discharge, ear pain, hearing loss, nosebleeds, sore throat and tinnitus.   Eyes: Negative for blurred vision and pain.  Respiratory: Positive for shortness of breath. Negative for cough, hemoptysis and wheezing.   Cardiovascular: Negative for chest pain, palpitations, orthopnea and leg swelling.  Gastrointestinal: Negative for abdominal pain, blood in stool, constipation, diarrhea, heartburn, nausea and vomiting.  Genitourinary: Negative for dysuria, frequency and urgency.  Musculoskeletal: Negative for back pain and myalgias.  Skin: Negative for itching and rash.  Neurological: Negative for dizziness, tingling, tremors, focal weakness, seizures, weakness and headaches.  Psychiatric/Behavioral: Negative for depression. The patient is not nervous/anxious.    DRUG ALLERGIES:  No Known Allergies VITALS:  Blood pressure (!) 154/74, pulse 83, temperature 98.6 F (37 C), temperature source Oral, resp. rate 17, height 5\' 9"  (1.753 m), weight 60.8 kg, SpO2 96 %. PHYSICAL EXAMINATION:  Physical Exam Constitutional:      General: He is in acute distress.     Appearance: He is underweight.  HENT:     Head: Normocephalic and atraumatic.  Eyes:     Conjunctiva/sclera: Conjunctivae normal.     Pupils: Pupils are equal, round,  and reactive to light.  Neck:     Thyroid: No thyromegaly.     Trachea: No tracheal deviation.  Cardiovascular:     Rate and Rhythm: Normal rate and regular rhythm.     Heart sounds: Normal heart sounds.  Pulmonary:     Effort: Pulmonary effort is normal. No respiratory distress.     Breath sounds: Examination of the right-lower field reveals decreased breath sounds. Examination of the left-lower field reveals decreased breath sounds. Decreased breath sounds present. No wheezing.  Chest:     Chest wall: No tenderness.  Abdominal:     General: Bowel sounds are normal. There is no distension.     Palpations: Abdomen is soft.     Tenderness: There is no abdominal tenderness.  Musculoskeletal:        General: Normal range of motion.     Cervical back: Normal range of motion and neck supple.  Skin:    General: Skin is warm and dry.     Findings: No rash.  Neurological:     Mental Status: He is alert and oriented to person, place, and time.     Cranial Nerves: No cranial nerve deficit.    LABORATORY PANEL:  Male CBC Recent Labs  Lab 12/16/19 0317  WBC 3.9*  HGB 14.0  HCT 39.6  PLT 337   ------------------------------------------------------------------------------------------------------------------ Chemistries  Recent Labs  Lab 12/16/19 0317  NA 134*  K 4.4  CL 100  CO2 23  GLUCOSE 267*  BUN 21  CREATININE 0.62  CALCIUM 8.7*  MG 2.2  AST 32  ALT 53*  ALKPHOS 55  BILITOT 0.8   RADIOLOGY:  No results found. ASSESSMENT AND PLAN:  64 year old diabetic male admitted for COVID-19 pneumonia  Acute respiratory failure with hypoxia due to acute respiratory disease due to COVID-19 virus: Patient had oxygen desaturated to 81% on room air, initially needed 4 L nasal cannula oxygen, weaned off to 1 L nasal cannula oxygen with oxygen saturation 97%.  Chest x-ray showed bilateral patchy infiltration.  But again oxygen requirement went up last evening with minimal exertion  and now he is back on 4 L.  -Continue Remdesivir day 2/5 -Solumedrol 40 mg bid -vitamin C, zinc.  -Bronchodilators -PRN Mucinex for cough -Negative blood culture -Daily CRP, Ferritin, D-dimer, -Will ask the patient to maintain an awake prone position for 16+ hours a day, if possible, with a minimum of 2-3 hours at a time -Will attempt to maintain euvolemia to a net negative fluid status, will request strict I's and O's as there is none documented. -IF patient deteriorates, will consult PCCM and ID  Diarrhea: Most likely due to Covid infection. -none while here.  Confirmed with patient.  Diabetes mellitus without complication ([place]):  Most recent A1c 11.0, poorly controled. Patient is taking Metformin and Amaryl at home -Start him on COVID-19 glycemic control protocol -Diabetic nurse following      DVT prophylaxis: Lovenox Family Communication: Discussed with patient's daughter Elmer Ramp over phone at [phone] Disposition Plan:  Came from home Discharge back home Barriers to DC -none anticipated at this time.  Will need 3 more days of remdesivir before discharge planning. he lives with his wife at home and his daughters are in [place] and [place] area.   All the records are reviewed and case discussed with Care Management/Social Worker. Management plans discussed with the patient, family (daughter Elmer Ramp) and they are in agreement.  CODE STATUS: Full Code  TOTAL TIME TAKING CARE OF THIS PATIENT: 35 minutes.   More than 50% of the time was spent in counseling/coordination of care: YES  POSSIBLE D/C IN 3 DAYS, DEPENDING ON CLINICAL CONDITION.   Max Sane M.D on 12/16/2019 at 12:21 PM  [facility]   CC: Primary care physician; Tracie Harrier, MD  Note: This dictation was prepared with Dragon dictation along with smaller phrase technology. Any transcriptional errors that result from this process are unintentional.

## 2019-12-17 DIAGNOSIS — J9601 Acute respiratory failure with hypoxia: Secondary | ICD-10-CM

## 2019-12-17 LAB — CBC WITH DIFFERENTIAL/PLATELET
Abs Immature Granulocytes: 0.05 10*3/uL (ref 0.00–0.07)
Basophils Absolute: 0 10*3/uL (ref 0.0–0.1)
Basophils Relative: 0 %
Eosinophils Absolute: 0 10*3/uL (ref 0.0–0.5)
Eosinophils Relative: 0 %
HCT: 37.3 % — ABNORMAL LOW (ref 39.0–52.0)
Hemoglobin: 13.2 g/dL (ref 13.0–17.0)
Immature Granulocytes: 1 %
Lymphocytes Relative: 6 %
Lymphs Abs: 0.6 10*3/uL — ABNORMAL LOW (ref 0.7–4.0)
MCH: 29.3 pg (ref 26.0–34.0)
MCHC: 35.4 g/dL (ref 30.0–36.0)
MCV: 82.9 fL (ref 80.0–100.0)
Monocytes Absolute: 0.6 10*3/uL (ref 0.1–1.0)
Monocytes Relative: 6 %
Neutro Abs: 8.9 10*3/uL — ABNORMAL HIGH (ref 1.7–7.7)
Neutrophils Relative %: 87 %
Platelets: 408 10*3/uL — ABNORMAL HIGH (ref 150–400)
RBC: 4.5 MIL/uL (ref 4.22–5.81)
RDW: 11.9 % (ref 11.5–15.5)
WBC: 10.2 10*3/uL (ref 4.0–10.5)
nRBC: 0 % (ref 0.0–0.2)

## 2019-12-17 LAB — MAGNESIUM: Magnesium: 2.2 mg/dL (ref 1.7–2.4)

## 2019-12-17 LAB — COMPREHENSIVE METABOLIC PANEL
ALT: 47 U/L — ABNORMAL HIGH (ref 0–44)
AST: 21 U/L (ref 15–41)
Albumin: 2.8 g/dL — ABNORMAL LOW (ref 3.5–5.0)
Alkaline Phosphatase: 47 U/L (ref 38–126)
Anion gap: 7 (ref 5–15)
BUN: 30 mg/dL — ABNORMAL HIGH (ref 8–23)
CO2: 26 mmol/L (ref 22–32)
Calcium: 8.4 mg/dL — ABNORMAL LOW (ref 8.9–10.3)
Chloride: 104 mmol/L (ref 98–111)
Creatinine, Ser: 0.64 mg/dL (ref 0.61–1.24)
GFR calc Af Amer: >60 mL/min (ref >60)
GFR calc non Af Amer: >60 mL/min (ref >60)
Glucose, Bld: 255 mg/dL — ABNORMAL HIGH (ref 70–99)
Potassium: 4.6 mmol/L (ref 3.5–5.1)
Sodium: 137 mmol/L (ref 135–145)
Total Bilirubin: 0.8 mg/dL (ref 0.3–1.2)
Total Protein: 6.8 g/dL (ref 6.5–8.1)

## 2019-12-17 LAB — GLUCOSE, CAPILLARY
Glucose-Capillary: 401 mg/dL — ABNORMAL HIGH (ref 70–99)
Glucose-Capillary: 246 mg/dL — ABNORMAL HIGH (ref 70–99)
Glucose-Capillary: 180 mg/dL — ABNORMAL HIGH (ref 70–99)
Glucose-Capillary: 241 mg/dL — ABNORMAL HIGH (ref 70–99)

## 2019-12-17 LAB — FERRITIN: Ferritin: 108 ng/mL (ref 24–336)

## 2019-12-17 LAB — FIBRIN DERIVATIVES D-DIMER (ARMC ONLY): Fibrin derivatives D-dimer (ARMC): 339.3 ng/mL (FEU) (ref 0.00–499.00)

## 2019-12-17 LAB — C-REACTIVE PROTEIN: CRP: 1.1 mg/dL — ABNORMAL HIGH (ref <1.0)

## 2019-12-17 MED ORDER — METHYLPREDNISOLONE SODIUM SUCC 40 MG IJ SOLR
40.0000 mg | Freq: Every day | INTRAMUSCULAR | Status: DC
  Administered 2019-12-18: 40 mg via INTRAVENOUS
  Filled 2019-12-17: qty 1

## 2019-12-17 MED ORDER — INSULIN DETEMIR 100 UNIT/ML ~~LOC~~ SOLN
10.0000 [IU] | Freq: Two times a day (BID) | SUBCUTANEOUS | Status: DC
  Administered 2019-12-17 – 2019-12-18 (×2): 10 [IU] via SUBCUTANEOUS
  Filled 2019-12-17 (×2): qty 1

## 2019-12-17 MED ORDER — INSULIN ASPART 100 UNIT/ML ~~LOC~~ SOLN
20.0000 [IU] | Freq: Once | SUBCUTANEOUS | Status: AC
  Administered 2019-12-17: 20 [IU] via SUBCUTANEOUS
  Filled 2019-12-17: qty 1

## 2019-12-17 MED ORDER — INSULIN ASPART 100 UNIT/ML ~~LOC~~ SOLN
6.0000 [IU] | Freq: Three times a day (TID) | SUBCUTANEOUS | Status: DC
  Administered 2019-12-17 – 2019-12-18 (×3): 6 [IU] via SUBCUTANEOUS
  Filled 2019-12-17 (×3): qty 1

## 2019-12-17 NOTE — Progress Notes (Addendum)
[place] at [place] NAME: Dennis Lawson    MR#:  [id number]  DATE OF BIRTH:  1956-08-22  SUBJECTIVE:  CHIEF COMPLAINT:   Chief Complaint  Patient presents with  . Shortness of Breath  . COVID  feels better, on RA now. Able to eat, sugars high REVIEW OF SYSTEMS:  Review of Systems  Constitutional: Positive for weight loss. Negative for diaphoresis, fever and malaise/fatigue.  HENT: Negative for ear discharge, ear pain, hearing loss, nosebleeds, sore throat and tinnitus.   Eyes: Negative for blurred vision and pain.  Respiratory: Positive for shortness of breath. Negative for cough, hemoptysis and wheezing.   Cardiovascular: Negative for chest pain, palpitations, orthopnea and leg swelling.  Gastrointestinal: Negative for abdominal pain, blood in stool, constipation, diarrhea, heartburn, nausea and vomiting.  Genitourinary: Negative for dysuria, frequency and urgency.  Musculoskeletal: Negative for back pain and myalgias.  Skin: Negative for itching and rash.  Neurological: Negative for dizziness, tingling, tremors, focal weakness, seizures, weakness and headaches.  Psychiatric/Behavioral: Negative for depression. The patient is not nervous/anxious.    DRUG ALLERGIES:  No Known Allergies VITALS:  Blood pressure 112/65, pulse 70, temperature 98.1 F (36.7 C), temperature source Oral, resp. rate 17, height 5\' 9"  (1.753 m), weight 60.8 kg, SpO2 97 %. PHYSICAL EXAMINATION:  Physical Exam Constitutional:      General: He is in acute distress.     Appearance: He is underweight.  HENT:     Head: Normocephalic and atraumatic.  Eyes:     Conjunctiva/sclera: Conjunctivae normal.     Pupils: Pupils are equal, round, and reactive to light.  Neck:     Thyroid: No thyromegaly.     Trachea: No tracheal deviation.  Cardiovascular:     Rate and Rhythm: Normal rate and regular rhythm.     Heart sounds: Normal heart sounds.  Pulmonary:     Effort:  Pulmonary effort is normal. No respiratory distress.     Breath sounds: Examination of the right-lower field reveals decreased breath sounds. Examination of the left-lower field reveals decreased breath sounds. Decreased breath sounds present. No wheezing.  Chest:     Chest wall: No tenderness.  Abdominal:     General: Bowel sounds are normal. There is no distension.     Palpations: Abdomen is soft.     Tenderness: There is no abdominal tenderness.  Musculoskeletal:        General: Normal range of motion.     Cervical back: Normal range of motion and neck supple.  Skin:    General: Skin is warm and dry.     Findings: No rash.  Neurological:     Mental Status: He is alert and oriented to person, place, and time.     Cranial Nerves: No cranial nerve deficit.    LABORATORY PANEL:  Male CBC Recent Labs  Lab 12/17/19 0257  WBC 10.2  HGB 13.2  HCT 37.3*  PLT 408*   ------------------------------------------------------------------------------------------------------------------ Chemistries  Recent Labs  Lab 12/17/19 0257  NA 137  K 4.6  CL 104  CO2 26  GLUCOSE 255*  BUN 30*  CREATININE 0.64  CALCIUM 8.4*  MG 2.2  AST 21  ALT 47*  ALKPHOS 47  BILITOT 0.8   RADIOLOGY:  No results found. ASSESSMENT AND PLAN:  64 year old diabetic male admitted for COVID-19 pneumonia  Acute respiratory failure with hypoxia due to acute respiratory disease due to COVID-19 virus: Patient had oxygen  desaturated to 81% on room air, initially needed 4 L nasal cannula oxygen, weaned off to RA now -Continue Remdesivir day 3/5 -Taper Solumedrol 40 mg bid to once daily -vitamin C, zinc.  -Bronchodilators -PRN Mucinex for cough -Negative blood culture -Daily CRP, Ferritin, D-dimer, -Will ask the patient to maintain an awake prone position for 16+ hours a day, if possible, with a minimum of 2-3 hours at a time -Will attempt to maintain euvolemia to a net negative fluid status,  strict I's  and O's requested  Diarrhea: Most likely due to Covid infection. -none while here.  Confirmed with patient.  Diabetes mellitus without complication (HCC):  Most recent A1c 11.0, poorly controlled. Patient is taking Metformin and Amaryl at home.  Increasing levemir 10 units BID, novolog 6 units TID - COVID-19 glycemic control protocol -Diabetic nurse following   DVT prophylaxis: Lovenox Family Communication: Discussed with patient's daughter Dennis Lawson over phone at [phone] on 12/16/2019 Disposition Plan:  Came from home Discharge back home Barriers to DC -none anticipated at this time.  Will need 2 more days of remdesivir before discharge planning. he lives with his wife at home and his daughters are in [place] area.   All the records are reviewed and case discussed with Care Management/Social Worker. Management plans discussed with the patient, family (daughter Dennis Lawson) and they are in agreement.  CODE STATUS: Full Code  TOTAL TIME TAKING CARE OF THIS PATIENT: 35 minutes.   More than 50% of the time was spent in counseling/coordination of care: YES  POSSIBLE D/C IN 2 DAYS, DEPENDING ON CLINICAL CONDITION.   Delfino Lovett M.D on 12/17/2019 at 5:30 PM  [facility]   CC: Primary care physician; Barbette Reichmann, MD  Note: This dictation was prepared with Dragon dictation along with smaller phrase technology. Any transcriptional errors that result from this process are unintentional.

## 2019-12-17 NOTE — Progress Notes (Signed)
Inpatient Diabetes Program Recommendations  AACE/ADA: New Consensus Statement on Inpatient Glycemic Control   Target Ranges:  Prepandial:   less than 140 mg/dL      Peak postprandial:   less than 180 mg/dL (1-2 hours)      Critically ill patients:  140 - 180 mg/dL  Results for [name], [name] (MRN [id number]) as of 12/17/2019 11:12  Ref. Range 12/16/2019 07:47 12/16/2019 11:24 12/16/2019 16:04 12/16/2019 21:11 12/17/2019 07:57 12/17/2019 11:24  Glucose-Capillary Latest Ref Range: 70 - 99 mg/dL 308 (H) 657 (H) 846 (H) 231 (H) 401 (H) 246 (H)    Review of Glycemic Control  Diabetes history: DM2 Outpatient Diabetes medications: Amaryl 2 am QAM, Metformin 1000 mg BID Current orders for Inpatient glycemic control: Levemir 5 units BID, Novolog 3 units TID with meals, Novolog 0-15 units TID with meals, Novolog 0-5 units QHS, Tradjenta 5 mg daily; Solumedrol 40 mg Q12H  Inpatient Diabetes Program Recommendations:   Insulin-IF steroids are continued, please consider increasing Levemir to 10 units BID and increasing meal coverage to Novolog 6 units TID with meals.   Thanks, Dennis Penner, RN, MSN, CDE Diabetes Coordinator Inpatient Diabetes Program [phone] (Team Pager from 8am to 5pm)

## 2019-12-17 NOTE — Progress Notes (Signed)
Patients blood sugar was 401, and parameter says to notify MD if blood sugar was over 400. Spoke with Dr. Sherryll Burger and he said to give 20 units Novolog in all.

## 2019-12-18 LAB — MAGNESIUM: Magnesium: 2.3 mg/dL (ref 1.7–2.4)

## 2019-12-18 LAB — CBC WITH DIFFERENTIAL/PLATELET
Abs Immature Granulocytes: 0.06 10*3/uL (ref 0.00–0.07)
Basophils Absolute: 0 10*3/uL (ref 0.0–0.1)
Basophils Relative: 0 %
Eosinophils Absolute: 0 10*3/uL (ref 0.0–0.5)
Eosinophils Relative: 0 %
HCT: 37.7 % — ABNORMAL LOW (ref 39.0–52.0)
Hemoglobin: 12.9 g/dL — ABNORMAL LOW (ref 13.0–17.0)
Immature Granulocytes: 1 %
Lymphocytes Relative: 15 %
Lymphs Abs: 1.5 10*3/uL (ref 0.7–4.0)
MCH: 29 pg (ref 26.0–34.0)
MCHC: 34.2 g/dL (ref 30.0–36.0)
MCV: 84.7 fL (ref 80.0–100.0)
Monocytes Absolute: 1.2 10*3/uL — ABNORMAL HIGH (ref 0.1–1.0)
Monocytes Relative: 12 %
Neutro Abs: 7.2 10*3/uL (ref 1.7–7.7)
Neutrophils Relative %: 72 %
Platelets: 432 10*3/uL — ABNORMAL HIGH (ref 150–400)
RBC: 4.45 MIL/uL (ref 4.22–5.81)
RDW: 12.1 % (ref 11.5–15.5)
WBC: 9.9 10*3/uL (ref 4.0–10.5)
nRBC: 0 % (ref 0.0–0.2)

## 2019-12-18 LAB — COMPREHENSIVE METABOLIC PANEL
ALT: 41 U/L (ref 0–44)
AST: 18 U/L (ref 15–41)
Albumin: 2.8 g/dL — ABNORMAL LOW (ref 3.5–5.0)
Alkaline Phosphatase: 45 U/L (ref 38–126)
Anion gap: 6 (ref 5–15)
BUN: 31 mg/dL — ABNORMAL HIGH (ref 8–23)
CO2: 26 mmol/L (ref 22–32)
Calcium: 8.5 mg/dL — ABNORMAL LOW (ref 8.9–10.3)
Chloride: 105 mmol/L (ref 98–111)
Creatinine, Ser: 0.73 mg/dL (ref 0.61–1.24)
GFR calc Af Amer: >60 mL/min (ref >60)
GFR calc non Af Amer: >60 mL/min (ref >60)
Glucose, Bld: 172 mg/dL — ABNORMAL HIGH (ref 70–99)
Potassium: 4.2 mmol/L (ref 3.5–5.1)
Sodium: 137 mmol/L (ref 135–145)
Total Bilirubin: 0.7 mg/dL (ref 0.3–1.2)
Total Protein: 6.5 g/dL (ref 6.5–8.1)

## 2019-12-18 LAB — FERRITIN: Ferritin: 88 ng/mL (ref 24–336)

## 2019-12-18 LAB — FIBRIN DERIVATIVES D-DIMER (ARMC ONLY): Fibrin derivatives D-dimer (ARMC): 304.23 ng/mL (FEU) (ref 0.00–499.00)

## 2019-12-18 LAB — GLUCOSE, CAPILLARY
Glucose-Capillary: 287 mg/dL — ABNORMAL HIGH (ref 70–99)
Glucose-Capillary: 181 mg/dL — ABNORMAL HIGH (ref 70–99)

## 2019-12-18 LAB — C-REACTIVE PROTEIN: CRP: 0.7 mg/dL (ref <1.0)

## 2019-12-18 MED ORDER — GUAIFENESIN 100 MG/5ML PO SOLN
5.0000 mL | ORAL | Status: DC | PRN
  Administered 2019-12-18: 100 mg via ORAL
  Filled 2019-12-18: qty 10

## 2019-12-18 MED ORDER — DOCUSATE SODIUM 100 MG PO CAPS
100.0000 mg | ORAL_CAPSULE | Freq: Two times a day (BID) | ORAL | Status: DC
  Administered 2019-12-18: 09:00:00 100 mg via ORAL
  Filled 2019-12-18: qty 1

## 2019-12-18 MED ORDER — POLYETHYLENE GLYCOL 3350 17 G PO PACK
17.0000 g | PACK | Freq: Every day | ORAL | Status: DC
  Administered 2019-12-18: 17 g via ORAL
  Filled 2019-12-18: qty 1

## 2019-12-18 MED ORDER — ENOXAPARIN SODIUM 40 MG/0.4ML ~~LOC~~ SOLN
40.0000 mg | SUBCUTANEOUS | Status: DC
  Administered 2019-12-18: 40 mg via SUBCUTANEOUS
  Filled 2019-12-18: qty 0.4

## 2019-12-18 MED ORDER — BISACODYL 10 MG RE SUPP: 10.0000 mg | Freq: Every day | RECTAL | Status: DC | PRN

## 2019-12-18 MED ORDER — SODIUM CHLORIDE 0.9 % IV SOLN: INTRAVENOUS | Status: DC | PRN

## 2019-12-18 MED ORDER — PNEUMOCOCCAL VAC POLYVALENT 25 MCG/0.5ML IJ INJ: 0.5000 mL | INJECTION | INTRAMUSCULAR | 0 refills | Status: DC

## 2019-12-18 MED ORDER — DEXAMETHASONE 4 MG PO TABS: 6.0000 mg | ORAL_TABLET | Freq: Every day | ORAL | Status: DC

## 2019-12-18 NOTE — Discharge Summary (Signed)
Physician Discharge Summary   Dennis Lawson  male DOB: Jul 29, 1956  UKG:[id number]  PCP: Tracie Harrier, MD  Admit date: 12/15/2019 Discharge date: 12/18/2019  Admitted From: home Disposition:  home CODE STATUS: Full code  Discharge Instructions    Diet - low sodium heart healthy   Complete by: As directed    Discharge instructions   Complete by: As directed    You just need 1 more day of COVID treatment with Remdesivir, which has been arranged at the infusion center at 10:00 AM on Thursday 3/4.   Transportation will be arranged to come pick you up to go to the infusion center.  For questions call [phone].    Since your inflammatory marker has been low, you do not need any more steroid treatment after you go home.    Dr. Enzo Bi - -   Increase activity slowly   Complete by: As directed        Hospital Course:  For full details, please see H&P, progress notes, consult notes and ancillary notes.  Briefly,  Dennis Lawson is a 64 y.o. male with medical history significant of diabetes mellitus, who presented with a short of breath and a cough.  Patient was diagnosed with COVID-19 infection on 12/08/19, he received monoclonal antibody treatment at the clinic on 2/25.  Patient continued to have shortness of breath and productive cough, and fever of 101.   Acute respiratory failure with hypoxiadue to COVID-19 PNA CXR showed Patchy bibasilar airspace disease.  Patient had oxygen desaturated to 81% on room air, initially needed 4 L nasal cannula oxygen, but weaned off to RA prior to discharge.  Pt received 4 days of Remdesivir and was scheduled to receive his 5th final dose at the infusion clinic on 12/19/19.  Pt received Solumedrol40 mg bid initially and then to once daily.  CRP was low on the day of discharge, so pt was not prescribed any more steroid on discharge.  Pt also received vitamin C, zinc and Bronchodilators during hospitalization.    Diarrhea, resolved Most likely  due to Covid infection, but resolved after presentation.  Diabetes mellitus without complication ([place]): Most recent A1c11.0, poorly controlled. Pt received Levemir and novolog with meals.  Since pt's BG were likely higher while inpatient due to high-dose steroids, I didn't change pt's home diabetic regimen and pt was discharged back on home Metformin and Amaryl.  Pt needs to follow up with outpatient provider to adjust his diabetic regimen for better BG control.     Discharge Diagnoses:  Principal Problem:   Acute respiratory disease due to COVID-19 virus Active Problems:   Diabetes mellitus without complication ([place])   Acute respiratory failure with hypoxia (HCC)   Diarrhea    Discharge Instructions:  Allergies as of 12/18/2019   No Known Allergies     Medication List    STOP taking these medications   azithromycin 250 MG tablet Commonly known as: ZITHROMAX     TAKE these medications   cyanocobalamin 1000 MCG tablet Take 1,000 mcg by mouth daily at 6 (six) AM.   glimepiride 2 MG tablet Commonly known as: AMARYL Take 2 mg by mouth daily with breakfast.   metFORMIN 1000 MG tablet Commonly known as: GLUCOPHAGE Take 1,000 mg by mouth 2 (two) times daily with a meal.       Follow-up Information    Tracie Harrier, MD. Daphane Shepherd on 12/30/2019.   Specialty: Internal Medicine Why: @10 :30am with PA, Rob Kohl's information: [address]  [address] [place] [address] [phone]           No Known Allergies   The results of significant diagnostics from this hospitalization (including imaging, microbiology, ancillary and laboratory) are listed below for reference.   Consultations:   Procedures/Studies: DG Chest Port 1 View  Result Date: 12/15/2019 CLINICAL DATA:  COVID positive. Short notice of breath. EXAM: PORTABLE CHEST 1 VIEW COMPARISON:  None. FINDINGS: Patchy airspace disease noted bilaterally with a basilar predominance, left  greater than right. No pleural effusion. The cardiopericardial silhouette is within normal limits for size. The visualized bony structures of the thorax are intact. Telemetry leads overlie the chest. IMPRESSION: Patchy bibasilar airspace disease, left greater than right. Electronically Signed   By: Kennith Center M.D.   On: 12/15/2019 09:00      Labs: BNP (last 3 results) Recent Labs    12/15/19 1343  BNP 44.0   Basic Metabolic Panel: Recent Labs  Lab 12/15/19 0825 12/16/19 0317 12/17/19 0257 12/18/19 0239  NA 132* 134* 137 137  K 3.8 4.4 4.6 4.2  CL 93* 100 104 105  CO2 23 23 26 26   GLUCOSE 260* 267* 255* 172*  BUN 10 21 30* 31*  CREATININE 0.72 0.62 0.64 0.73  CALCIUM 8.8* 8.7* 8.4* 8.5*  MG  --  2.2 2.2 2.3   Liver Function Tests: Recent Labs  Lab 12/15/19 0825 12/16/19 0317 12/17/19 0257 12/18/19 0239  AST 38 32 21 18  ALT 56* 53* 47* 41  ALKPHOS 55 55 47 45  BILITOT 1.1 0.8 0.8 0.7  PROT 8.0 7.1 6.8 6.5  ALBUMIN 3.2* 2.9* 2.8* 2.8*   No results for input(s): LIPASE, AMYLASE in the last 168 hours. No results for input(s): AMMONIA in the last 168 hours. CBC: Recent Labs  Lab 12/15/19 0825 12/16/19 0317 12/17/19 0257 12/18/19 0239  WBC 4.7 3.9* 10.2 9.9  NEUTROABS 2.9 2.9 8.9* 7.2  HGB 15.3 14.0 13.2 12.9*  HCT 43.0 39.6 37.3* 37.7*  MCV 81.6 82.5 82.9 84.7  PLT 278 337 408* 432*   Cardiac Enzymes: No results for input(s): CKTOTAL, CKMB, CKMBINDEX, TROPONINI in the last 168 hours. BNP: Invalid input(s): POCBNP CBG: Recent Labs  Lab 12/17/19 1124 12/17/19 1613 12/17/19 2110 12/18/19 0736 12/18/19 1122  GLUCAP 246* 180* 241* 287* 181*   D-Dimer No results for input(s): DDIMER in the last 72 hours. Hgb A1c No results for input(s): HGBA1C in the last 72 hours. Lipid Profile No results for input(s): CHOL, HDL, LDLCALC, TRIG, CHOLHDL, LDLDIRECT in the last 72 hours. Thyroid function studies No results for input(s): TSH, T4TOTAL, T3FREE,  THYROIDAB in the last 72 hours.  Invalid input(s): FREET3 Anemia work up 02/17/20    12/17/19 0257 12/18/19 0239  FERRITIN 108 88   Urinalysis No results found for: COLORURINE, APPEARANCEUR, LABSPEC, PHURINE, GLUCOSEU, HGBUR, BILIRUBINUR, KETONESUR, PROTEINUR, UROBILINOGEN, NITRITE, LEUKOCYTESUR Sepsis Labs Invalid input(s): PROCALCITONIN,  WBC,  LACTICIDVEN Microbiology Recent Results (from the past 240 hour(s))  Blood Culture (routine x 2)     Status: None (Preliminary result)   Collection Time: 12/15/19  8:25 AM   Specimen: BLOOD  Result Value Ref Range Status   Specimen Description BLOOD LFA  Final   Special Requests   Final    BOTTLES DRAWN AEROBIC AND ANAEROBIC Blood Culture results may not be optimal due to an inadequate volume of blood received in culture bottles   Culture   Final    NO GROWTH 3 DAYS  Performed at [facility], [address] [place], [place] [address]    Report Status PENDING  Incomplete  Blood Culture (routine x 2)     Status: None (Preliminary result)   Collection Time: 12/15/19  8:25 AM   Specimen: BLOOD  Result Value Ref Range Status   Specimen Description BLOOD RFA  Final   Special Requests   Final    BOTTLES DRAWN AEROBIC AND ANAEROBIC Blood Culture results may not be optimal due to an inadequate volume of blood received in culture bottles   Culture   Final    NO GROWTH 3 DAYS Performed at [facility], [address], [place], [place] [address]    Report Status PENDING  Incomplete     Total time spend on discharging this patient, including the last patient exam, discussing the hospital stay, instructions for ongoing care as it relates to all pertinent caregivers, as well as preparing the medical discharge records, prescriptions, and/or referrals as applicable, is 30 minutes.    Darlin Priestly, MD  [facility] 12/18/2019, 2:18 PM  If 7PM-7AM, please contact night-coverage

## 2019-12-18 NOTE — Discharge Instructions (Addendum)
You are scheduled for an outpatient infusion of Remdesivir at 10:00 AM on Thursday 3/4.  Please report to [facility] at [address]  Drive to the security guard and tell them you are here for an infusion. They will direct you to the front entrance where we will come and get you.  For questions call [phone].  Thanks

## 2019-12-18 NOTE — Progress Notes (Signed)
Patient scheduled for outpatient Remdesivir infusion at 10:00 AM on Thursday 3/4.   Please advise them to report to [facility] at [address].  Drive to the security guard and tell them you are here for an infusion. They will direct you to the front entrance where we will come and get you.  For questions call [phone].  Thanks

## 2019-12-19 ENCOUNTER — Ambulatory Visit (HOSPITAL_COMMUNITY)
Admission: RE | Admit: 2019-12-19 | Discharge: 2019-12-19 | Disposition: A | Discharge status: Home / Self Care | Payer: 59 | Source: Ambulatory Visit | Attending: Pulmonary Disease | Admitting: Pulmonary Disease

## 2019-12-19 DIAGNOSIS — Z20822 Contact with and (suspected) exposure to covid-19: Secondary | ICD-10-CM | POA: Diagnosis present

## 2019-12-19 MED ORDER — SODIUM CHLORIDE 0.9 % IV SOLN: INTRAVENOUS | Status: DC | PRN

## 2019-12-19 MED ORDER — SODIUM CHLORIDE 0.9 % IV SOLN
100.0000 mg | Freq: Once | INTRAVENOUS | Status: AC
  Administered 2019-12-19: 100 mg via INTRAVENOUS
  Filled 2019-12-19: qty 20

## 2019-12-19 MED ORDER — EPINEPHRINE 0.3 MG/0.3ML IJ SOAJ: 0.3000 mg | Freq: Once | INTRAMUSCULAR | Status: DC | PRN

## 2019-12-19 MED ORDER — DIPHENHYDRAMINE HCL 50 MG/ML IJ SOLN: 50.0000 mg | Freq: Once | INTRAMUSCULAR | Status: DC | PRN

## 2019-12-19 MED ORDER — ALBUTEROL SULFATE HFA 108 (90 BASE) MCG/ACT IN AERS: 2.0000 | INHALATION_SPRAY | Freq: Once | RESPIRATORY_TRACT | Status: DC | PRN

## 2019-12-19 MED ORDER — FAMOTIDINE IN NACL 20-0.9 MG/50ML-% IV SOLN: 20.0000 mg | Freq: Once | INTRAVENOUS | Status: DC | PRN

## 2019-12-19 MED ORDER — METHYLPREDNISOLONE SODIUM SUCC 125 MG IJ SOLR: 125.0000 mg | Freq: Once | INTRAMUSCULAR | Status: DC | PRN

## 2019-12-19 NOTE — Progress Notes (Signed)
  Diagnosis: COVID-19  Physician: Dr. Shan Levans  Procedure: [facility] Med: remdesivir infusion.  Complications: No immediate complications noted.  Discharge: Discharged home   Essie Hart 12/19/2019

## 2019-12-20 LAB — CULTURE, BLOOD (ROUTINE X 2)
Culture: NO GROWTH
Culture: NO GROWTH

## 2020-01-03 ENCOUNTER — Other Ambulatory Visit: Payer: Self-pay

## 2021-05-04 IMAGING — DX DG CHEST 1V PORT
1 series · 1 of 1 positions shown · non-contrast
Comparison: None.

CLINICAL DATA: COVID positive. Short notice of breath.

EXAM:
PORTABLE CHEST 1 VIEW

[chest ap]
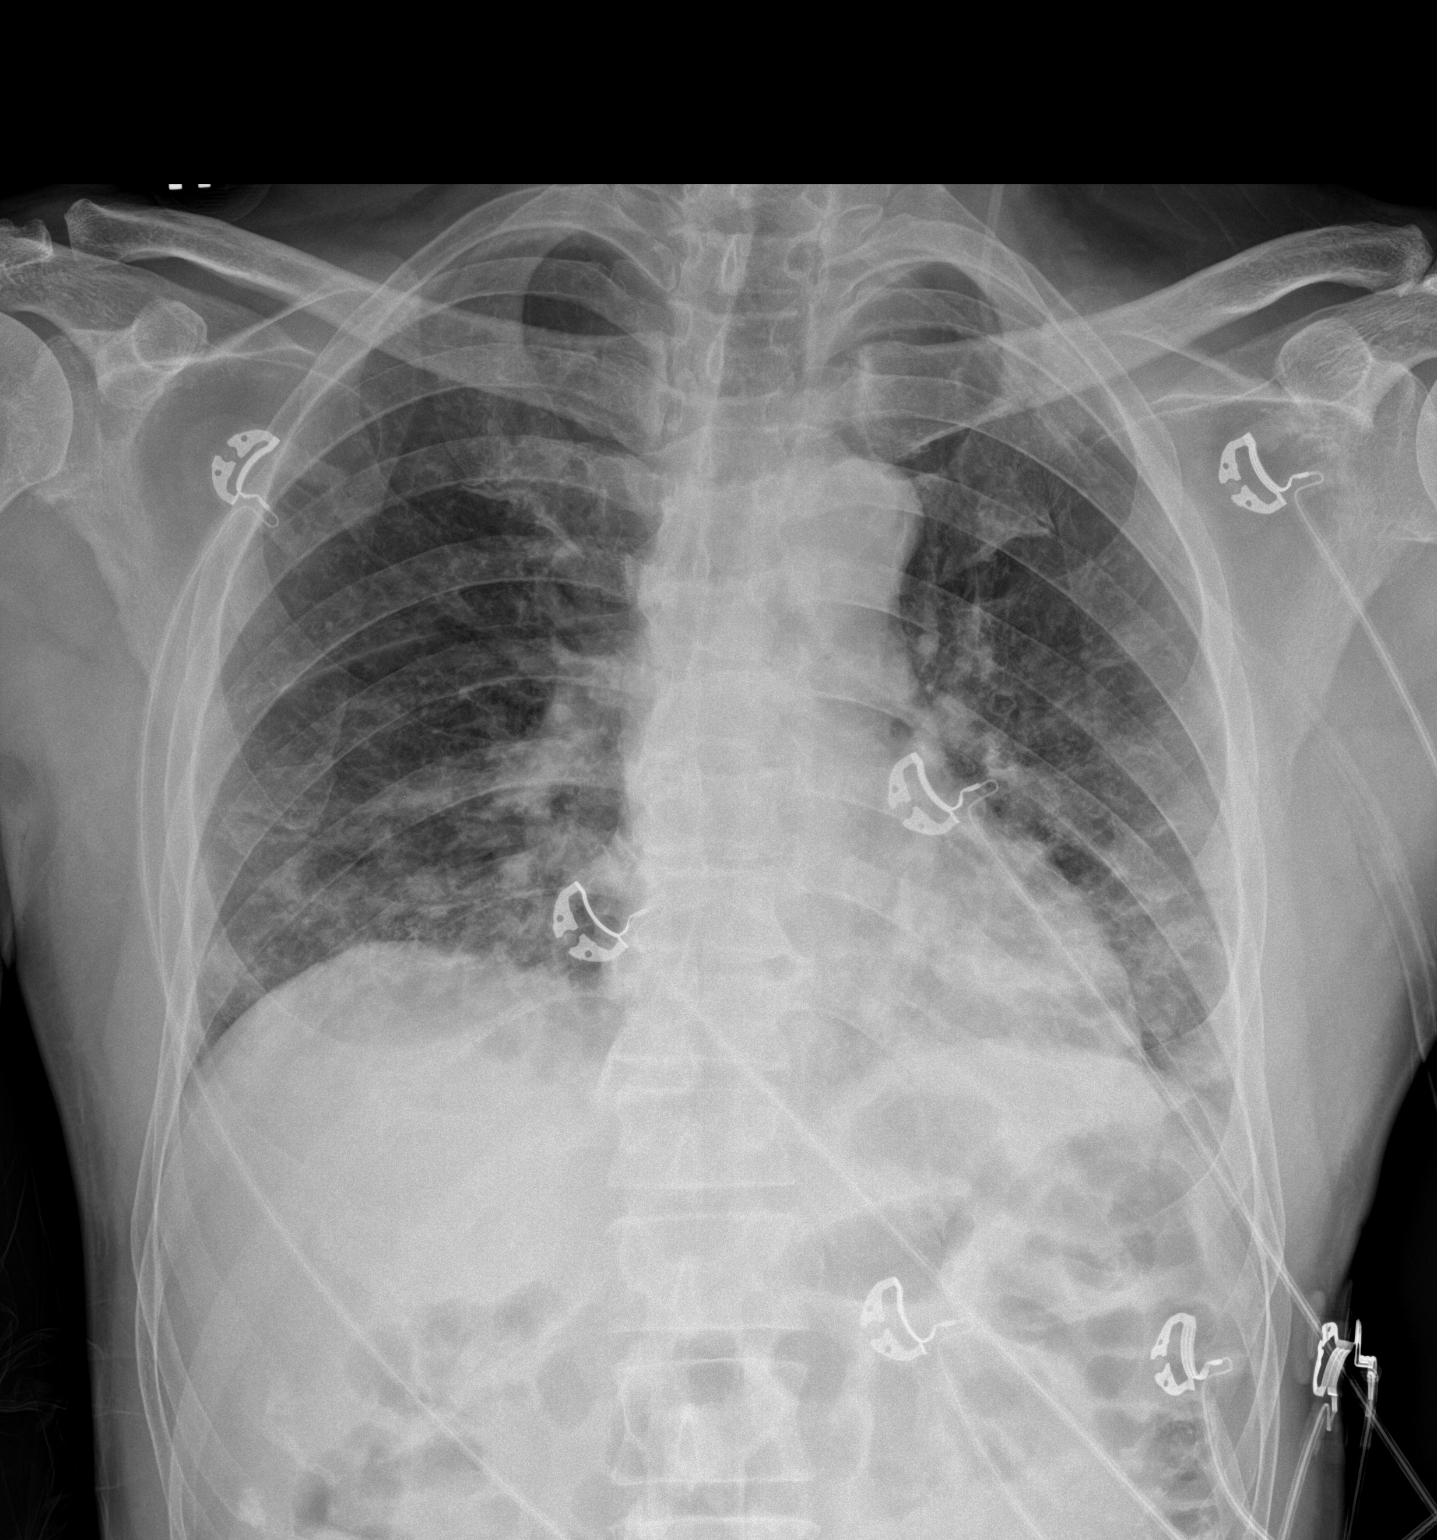

[1 of 1 positions shown; findings below may reference images not displayed]

FINDINGS: Patchy airspace disease noted bilaterally with a basilar
predominance, left greater than right. No pleural effusion. The
cardiopericardial silhouette is within normal limits for size. The
visualized bony structures of the thorax are intact. Telemetry leads
overlie the chest.
IMPRESSION: Patchy bibasilar airspace disease, left greater than right.

## 2023-02-27 DIAGNOSIS — R7309 Other abnormal glucose: Secondary | ICD-10-CM | POA: Diagnosis not present

## 2023-02-27 DIAGNOSIS — R200 Anesthesia of skin: Secondary | ICD-10-CM | POA: Diagnosis not present

## 2023-02-27 DIAGNOSIS — R7989 Other specified abnormal findings of blood chemistry: Secondary | ICD-10-CM | POA: Diagnosis not present

## 2023-02-27 DIAGNOSIS — E7800 Pure hypercholesterolemia, unspecified: Secondary | ICD-10-CM | POA: Diagnosis not present

## 2023-02-27 DIAGNOSIS — Z125 Encounter for screening for malignant neoplasm of prostate: Secondary | ICD-10-CM | POA: Diagnosis not present

## 2023-02-27 DIAGNOSIS — Z0000 Encounter for general adult medical examination without abnormal findings: Secondary | ICD-10-CM | POA: Diagnosis not present

## 2023-02-27 DIAGNOSIS — R202 Paresthesia of skin: Secondary | ICD-10-CM | POA: Diagnosis not present

## 2023-02-27 DIAGNOSIS — E1165 Type 2 diabetes mellitus with hyperglycemia: Secondary | ICD-10-CM | POA: Diagnosis not present

## 2023-02-27 DIAGNOSIS — M659 Synovitis and tenosynovitis, unspecified: Secondary | ICD-10-CM | POA: Diagnosis not present

## 2023-02-27 DIAGNOSIS — N390 Urinary tract infection, site not specified: Secondary | ICD-10-CM | POA: Diagnosis not present

## 2023-02-28 DIAGNOSIS — R7989 Other specified abnormal findings of blood chemistry: Secondary | ICD-10-CM | POA: Diagnosis not present

## 2023-02-28 DIAGNOSIS — E119 Type 2 diabetes mellitus without complications: Secondary | ICD-10-CM | POA: Diagnosis not present

## 2023-02-28 DIAGNOSIS — Z0000 Encounter for general adult medical examination without abnormal findings: Secondary | ICD-10-CM | POA: Diagnosis not present

## 2023-07-05 DIAGNOSIS — R7989 Other specified abnormal findings of blood chemistry: Secondary | ICD-10-CM | POA: Diagnosis not present

## 2023-07-05 DIAGNOSIS — E7800 Pure hypercholesterolemia, unspecified: Secondary | ICD-10-CM | POA: Diagnosis not present

## 2023-07-05 DIAGNOSIS — Z125 Encounter for screening for malignant neoplasm of prostate: Secondary | ICD-10-CM | POA: Diagnosis not present

## 2023-07-05 DIAGNOSIS — E1165 Type 2 diabetes mellitus with hyperglycemia: Secondary | ICD-10-CM | POA: Diagnosis not present

## 2023-07-05 DIAGNOSIS — R200 Anesthesia of skin: Secondary | ICD-10-CM | POA: Diagnosis not present

## 2023-07-05 DIAGNOSIS — Z0000 Encounter for general adult medical examination without abnormal findings: Secondary | ICD-10-CM | POA: Diagnosis not present

## 2023-07-05 DIAGNOSIS — R7309 Other abnormal glucose: Secondary | ICD-10-CM | POA: Diagnosis not present

## 2023-07-07 DIAGNOSIS — R7989 Other specified abnormal findings of blood chemistry: Secondary | ICD-10-CM | POA: Diagnosis not present

## 2023-07-07 DIAGNOSIS — Z0000 Encounter for general adult medical examination without abnormal findings: Secondary | ICD-10-CM | POA: Diagnosis not present

## 2023-07-07 DIAGNOSIS — Z23 Encounter for immunization: Secondary | ICD-10-CM | POA: Diagnosis not present

## 2023-07-07 DIAGNOSIS — E119 Type 2 diabetes mellitus without complications: Secondary | ICD-10-CM | POA: Diagnosis not present

## 2024-01-04 DIAGNOSIS — E119 Type 2 diabetes mellitus without complications: Secondary | ICD-10-CM | POA: Diagnosis not present

## 2024-01-04 DIAGNOSIS — Z0000 Encounter for general adult medical examination without abnormal findings: Secondary | ICD-10-CM | POA: Diagnosis not present

## 2024-01-04 DIAGNOSIS — E538 Deficiency of other specified B group vitamins: Secondary | ICD-10-CM | POA: Diagnosis not present

## 2024-01-04 DIAGNOSIS — R7989 Other specified abnormal findings of blood chemistry: Secondary | ICD-10-CM | POA: Diagnosis not present

## 2024-01-04 DIAGNOSIS — E1165 Type 2 diabetes mellitus with hyperglycemia: Secondary | ICD-10-CM | POA: Diagnosis not present

## 2024-01-04 DIAGNOSIS — Z1211 Encounter for screening for malignant neoplasm of colon: Secondary | ICD-10-CM | POA: Diagnosis not present

## 2024-01-05 DIAGNOSIS — R7989 Other specified abnormal findings of blood chemistry: Secondary | ICD-10-CM | POA: Diagnosis not present

## 2024-01-05 DIAGNOSIS — E7800 Pure hypercholesterolemia, unspecified: Secondary | ICD-10-CM | POA: Diagnosis not present

## 2024-01-05 DIAGNOSIS — E1165 Type 2 diabetes mellitus with hyperglycemia: Secondary | ICD-10-CM | POA: Diagnosis not present

## 2024-01-18 DIAGNOSIS — E1165 Type 2 diabetes mellitus with hyperglycemia: Secondary | ICD-10-CM | POA: Diagnosis not present

## 2024-01-18 DIAGNOSIS — E1169 Type 2 diabetes mellitus with other specified complication: Secondary | ICD-10-CM | POA: Diagnosis not present

## 2024-01-18 DIAGNOSIS — E785 Hyperlipidemia, unspecified: Secondary | ICD-10-CM | POA: Diagnosis not present

## 2024-04-23 ENCOUNTER — Ambulatory Visit: Admission: RE | Admit: 2024-04-23 | Payer: Self-pay | Source: Home / Self Care | Admitting: Gastroenterology

## 2024-04-23 SURGERY — COLONOSCOPY
Anesthesia: General

## 2024-05-22 ENCOUNTER — Ambulatory Visit: Admission: RE | Admit: 2024-05-22 | Source: Home / Self Care | Admitting: Gastroenterology

## 2024-05-22 ENCOUNTER — Encounter: Admission: RE | Payer: Self-pay | Source: Home / Self Care

## 2024-05-22 SURGERY — COLONOSCOPY
Anesthesia: General
